# Patient Record
Sex: Male | Born: 1980 | Race: White | Hispanic: No | Marital: Single | Smoking: Former smoker
Health system: Southern US, Community
[De-identification: ages and names within clinical notes are randomized; demographics above are authoritative.]

## PROBLEM LIST (undated history)

## (undated) DIAGNOSIS — F988 Other specified behavioral and emotional disorders with onset usually occurring in childhood and adolescence: Secondary | ICD-10-CM

## (undated) HISTORY — DX: Other specified behavioral and emotional disorders with onset usually occurring in childhood and adolescence: F98.8

---
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
## 2002-01-01 ENCOUNTER — Emergency Department (HOSPITAL_COMMUNITY): Admission: EM | Admit: 2002-01-01 | Discharge: 2002-01-01 | Payer: Self-pay | Admitting: Emergency Medicine

## 2002-06-18 ENCOUNTER — Encounter: Payer: Self-pay | Admitting: Internal Medicine

## 2002-06-18 ENCOUNTER — Encounter: Admission: RE | Admit: 2002-06-18 | Discharge: 2002-06-18 | Payer: Self-pay | Admitting: Internal Medicine

## 2003-05-25 ENCOUNTER — Emergency Department (HOSPITAL_COMMUNITY): Admission: AD | Admit: 2003-05-25 | Discharge: 2003-05-25 | Payer: Self-pay | Admitting: Family Medicine

## 2006-07-20 ENCOUNTER — Inpatient Hospital Stay (HOSPITAL_COMMUNITY): Admission: EM | Admit: 2006-07-20 | Discharge: 2006-07-21 | Payer: Self-pay | Admitting: Emergency Medicine

## 2006-10-23 ENCOUNTER — Emergency Department (HOSPITAL_COMMUNITY): Admission: EM | Admit: 2006-10-23 | Discharge: 2006-10-23 | Payer: Self-pay | Admitting: Emergency Medicine

## 2010-10-01 NOTE — Consult Note (Signed)
Kevin Johnson, Kevin Johnson       ACCOUNT NO.:  [id number]   MEDICAL RECORD NO.:  [id number]          PATIENT TYPE:  INP   LOCATION:  6715                         FACILITY:  [facility]   PHYSICIAN:  Antonietta Breach, M.D.  DATE OF BIRTH:  [birth year]   DATE OF CONSULTATION:  07/21/2006  DATE OF DISCHARGE:  07/21/2006                                 CONSULTATION   REQUESTING PHYSICIAN:  Lonia Blood, M.D.   REASON FOR CONSULTATION:  Overdose.   HISTORY OF PRESENT ILLNESS:  [name] is a 30 year old  male admitted to the [facility] on July 20, 2006 after an overdose.   Mr. Lurry's girlfriend broke up with him the night before admission.  He drank unknown quantities of alcohol and went into a blackout.  He is  reported to have taken 25 tablets of Xanax; however, he does not recall  taking the overdose nor does he recall any suicidal thoughts.  He does  recall feeling very depressed after his girlfriend gave him the news.   Currently, he states that the disappointment is not anywhere near worth  taking his life for.  He denies any suicidal thoughts.  He denies  thoughts of harming others.  He does not have any hallucinations or  delusions.  He reports that he has many close friends and a number of  interests to live for.  He has positive goals for the future and hope.   His close friends were visiting him in the hospital today.  He is  socially appropriate and cooperative.   The patient reports that the girlfriend breaking up with him is not the  first time that she has indicated that she is not interested in the  relationship.  She has not shown significant interest for several weeks  and...Marland KitchenMarland KitchenMarland Kitchen   DICTATION ENDED AT THIS POINT      Antonietta Breach, M.D.  Electronically Signed     JW/MEDQ  D:  07/23/2006  T:  07/24/2006  Job:  [id number]

## 2010-10-01 NOTE — Discharge Summary (Signed)
Kevin Johnson, Kevin Johnson       ACCOUNT NO.:  [id number]   MEDICAL RECORD NO.:  [id number]          PATIENT TYPE:  INP   LOCATION:  6715                         FACILITY:  [facility]   PHYSICIAN:  Altha Harm, MDDATE OF BIRTH:  14-Jul-1980   DATE OF ADMISSION:  07/20/2006  DATE OF DISCHARGE:  07/21/2006                               DISCHARGE SUMMARY   DISCHARGE DISPOSITION:  Home.   FINAL DISCHARGE DIAGNOSES:  1. Failed suicide attempt.  2. History of anxiety.  3. History of depression.  4. History of alcohol abuse.   DISCHARGE MEDICATIONS:  1. Xanax 0.5 mg p.o. t.i.d. p.r.n.  2. Thiamine 50 mg p.o. daily.  3. Folate 1 mg p.o. daily.   CONSULTANTS:  Dr. Jeanie Sewer, psychiatry.   PROCEDURE:  None.   DIAGNOSTIC STUDIES:  Portable chest x-ray, which shows no acute  findings.   PRIMARY CARE PHYSICIAN:  Unassigned.   ALLERGIES:  NO KNOWN DRUG ALLERGIES.   CHIEF COMPLAINT:  Obtundation after taking 25 Xanax.   HISTORY OF PRESENT ILLNESS:  Please see H&P dictated by Dr. Mikeal Hawthorne for  details of the history of present illness.   HOSPITAL COURSE:  The patient was admitted on an observation basis.  He  was given IV hydration and placed on a monitor.  The patient had no  dysrhythmias or ectopy noted throughout his hospitalization.  Approximately 6 hours after admission, the patient awoke and had no  respiratory distress throughout the rest of his observation period.  The  patient was seen in consultation by Dr. Jeanie Sewer for his failed  suicidal attempt.  Dr. Jeanie Sewer felt that he could function as an  outpatient, for follow up with outpatient psychiatric therapy and  recommended that his dose of Xanax be decreased from 1 mg p.o. t.i.d.  down to 0.5 mg p.o. t.i.d. p.r.n.  The patient was given enough Xanax to  last for 4 days, at which time, he is to follow up with outpatient  psychiatry for further management.   CONDITION ON DISCHARGE:  Stable.   The patient has been  cautioned against alcohol abuse and overuse.  The  patient was offered opportunity for rehab, at this time he declined.      Altha Harm, MD  Electronically Signed     MAM/MEDQ  D:  07/21/2006  T:  07/22/2006  Job:  [id number]   cc:   Antonietta Breach, M.D.

## 2010-10-01 NOTE — Consult Note (Signed)
Kevin Johnson, Kevin Johnson       ACCOUNT NO.:  [id number]   MEDICAL RECORD NO.:  [id number]          PATIENT TYPE:  INP   LOCATION:  6715                         FACILITY:  [facility]   PHYSICIAN:  Antonietta Breach, M.D.  DATE OF BIRTH:  [birth year]   DATE OF CONSULTATION:  07/21/2006  DATE OF DISCHARGE:  07/21/2006                                 CONSULTATION   REQUESTING PHYSICIAN:  Lonia Blood, M.D.   REASON FOR CONSULTATION:  Overdose.   HISTORY OF PRESENT ILLNESS:  Mr. Kevin Johnson is a 30 year old  male admitted to the [facility] on 6 March due to an overdose.   [name] denies any suicidal intent.  He does not remember any  suicidal ideation.  He does remember getting into a conversation with is  ex-girlfriend where she repeated her previous response to him that she  is not interested in the relationship.   He reports that for several weeks, she has been showing a lack of  interest, and he states that a loss of her relationship would not  warrant suicide.  He remembers starting to drink large quantities of  alcohol.  He does not recall the overdose of Xanax nor does he recall  any suicidal thoughts.   He was very shocked when he woke up in a hospital.  He has normal  interest in the future with constructive goals.  He has no  hallucinations or delusions.  He has several supportive friends who are  visiting him in the hospital.   He does have a history of panic attacks, feeling on edge, excess worry,  muscle tension that have been treated with Xanax 1 mg 3 times a day.  He  is socially appropriate and cooperative with staff care and is oriented  to all spheres.   PAST PSYCHIATRIC HISTORY:  Mr. Kevin Johnson has been tried on Celexa which  did not work for his anxiety, and his primary care physician has  maintained him on Xanax 1 mg 3 times a day.  He has no history of  suicide attempts.  He has no history of psychiatric are.   He has no history of decreased  need for sleep or increased energy.  He  has no history of hallucinations or delusions.  He denies any illegal  drugs.   He does acknowledge that he has been convicted of a DWI and did attend  outpatient alcohol classes.   FAMILY PSYCHIATRIC HISTORY:  None known.   SOCIAL HISTORY:  [name] is single.  He is a Engineer, agricultural.  He has no children.  He works as an International aid/development worker at [employer].  He  lives alone.  Please see the history above.   GENERAL MEDICAL PROBLEMS:  Status post alcohol and Xanax intoxication.   MEDICATIONS:  The MAR is reviewed.  The patient is on folic acid 1 mg  daily and thiamine 100 mg daily.   ALLERGIES:  No known drug allergies.   LABORATORY DATA:  WBC 8.6, hemoglobin 13.9, platelet count 266.  INR  1.4.  Metabolic panel shows a BUN of 9, creatinine 1.02, SGOT 30, SGPT  24, calcium 9.2.  Albumin 4.5, lipase 22. Tylenol was negative.  Aspirin  negative.  Urine drug screen was positive for tetrahydrocannabinol and  benzodiazepines.  Alcohol level upon ER assessment was 163.   REVIEW OF SYSTEMS:  CONSTITUTIONAL: Afebrile.  HEAD: No trauma.  EYES:  No visual changes.  EARS: No hearing impairment.  NOSE:  No rhinorrhea.  MOUTH:  No sore throat.  NEUROLOGIC: Unremarkable.  PSYCHIATRIC:  As  above.  CARDIOVASCULAR: No chest pain, palpitations, or edema.  RESPIRATORY: No coughing or wheezing. GASTROINTESTINAL: No nausea,  vomiting, diarrhea.  GENITOURINARY:  No dysuria.  SKIN: Unremarkable.  ENDOCRINE/METABOLIC:  Unremarkable.  MUSCULOSKELETAL:  No deformities,  weakness, or atrophy.  HEMATOLOGIC/LYMPHATIC:  Unremarkable.   PHYSICAL EXAMINATION:  VITAL SIGNS:  Temperature 98.7, pulse 67,  respirations 18,  blood pressure 122/77, O2 saturation on room air 97%.  GENERAL:  Kevin Johnson has no alcohol withdrawal signs or symptoms.  He  has no tremor or sweats.   MENTAL STATUS EXAM:  Mr. Kevin Johnson is a young male appearing his  chronologic age of 25  years, partially reclined in a supine position in  his hospital bed, alert with good eye contact.  His psychomotor tone is  generally within normal limits.  He is well groomed and socially  appropriate.  He is cooperative with the interview.  His affect is  slightly anxious at baseline but with broad and appropriate responses.  Mood is slightly anxious.  He is oriented to all spheres.  His memory is  intact to immediate, recent, and remote except for the blackout period.  Please see the History of Present Illness.  His fund of knowledge and  intelligence are within normal limits.  His speech involves normal rate  and prosody.  Though process is logical, coherent, goal directed.  No  looseness of associations.  Thought content: No thoughts of harming  himself.  No thoughts of harming others.  No delusions.  No  hallucinations.  Concentration is grossly within normal limits.  His  insight is good.  His judgment is intact.   ASSESSMENT:  AXIS I:  1. 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  2. Adjustment disorder with mixed disturbance of emotions and conduct.  3. Alcohol abuse, rule out polysubstance dependence, cannabis abuse.  AXIS II:  Deferred.  AXIS III:  See General Medical Problems.  AXIS IV:  Primary support group.  AXIS V:  55.   [name] is no longer at risk to harm himself or others after  recovering from his severe intoxication.  He agrees to call emergency  services immediately for any thoughts of harming himself, thoughts of  harming others, or distress.   He realizes that alcohol can severely impair his judgment and decrease  his inhibitions as well as increasing his impulsivity.   The undersigned provided ego supportive psychotherapy and education.  The patient declines the recommendation for inpatient CD rehabilitation;  however, he mentioned that he would be interested in an outpatient  chemical dependency program.  RECOMMENDATIONS:  1. Would ask  the case manager to set this patient up with a chemical      dependence intensive outpatient program at one of the psychiatric      clinics attached to [facility], [facility], or [facility].  2. This patient would also be a candidate for cognitive behavioral      therapy with deep breathing and progressive muscle relaxation with      a goal of trying  to eliminate his need for benzodiazepines.  3. During his psychiatric followup as part of the intensive outpatient      program, he could pursue possibly a retrial of an SSRI.  May      patients have history of suboptimal dosing and suboptimal duration      of SSRI trial in the treatment of anxiety because anxiety      treatment, particularly the cognitive anxiety symptoms, requires a      much greater length of treatment than depression.      Antonietta Breach, M.D.  Electronically Signed     JW/MEDQ  D:  07/23/2006  T:  07/23/2006  Job:  [id number]   cc:   Lonia Blood, M.D.

## 2010-10-01 NOTE — H&P (Signed)
NAMETYRONE, PAUTSCH       ACCOUNT NO.:  [id number]   MEDICAL RECORD NO.:  [id number]          PATIENT TYPE:  INP   LOCATION:  6715                         FACILITY:  [facility]   PHYSICIAN:  Lonia Blood, M.D.      DATE OF BIRTH:  [birth year]   DATE OF ADMISSION:  07/20/2006  DATE OF DISCHARGE:                              HISTORY & PHYSICAL   PRIMARY CARE PHYSICIAN:  Dr. Oneta Rack.   PRESENTING COMPLAINT:  Drug overdose.   HISTORY OF PRESENT ILLNESS:  Patient is a 30 year old white male with no  significant past medical history, who apparently broke up with his  girlfriend last night.  He was feeling so bad and depressed.  He took 25  tablets of Xanax and lots of alcohol.  He was brought in to the ED  obtunded.  Initial evaluation included giving the patient Narcan, but no  response.  However, he did wake up with some flumazenil 0.3 mg.  Patient  then drifted backwards into sleep at this point.  He is unable to give  any coherent history, although he is arousable with deep sternal rub.   PAST MEDICAL HISTORY:  Negative, except for possible depression.   ALLERGIES:  NONE.   MEDICATIONS:  None.   SOCIAL HISTORY:  Patient is single.  He drinks alcohol, unknown quantity  and no reported tobacco use.   FAMILY HISTORY:  Is currently unobtainable.   REVIEW OF SYSTEMS:  Also unobtainable due to patient's obtundation.   PHYSICAL EXAMINATION:  VITAL SIGNS:  On exam temperature 98.6, blood  pressure 108/42, pulse 87, respiratory rate 16.  Sats 95% on room air.  GENERAL:  Patient is currently obtunded, but calm, in no acute distress.  HEENT:  PERRL, EOMI.  NECK:  Is supple.  No JVD.  No lymphadenopathy.  RESPIRATORY:  He has good air entry bilaterally.  No wheezes.  No rales.  CARDIOVASCULAR:  Patient has S1, S2.  No murmurs.  ABDOMEN:  His abdomen is soft, nontender.  Positive bowel sounds.  EXTREMITIES:  No edema, cyanosis or clubbing.   LABS:  Showed white count 8.6, hemoglobin  13.9, and platelet count 266.  PT is 10.7, INR 1.4.  Urinalysis essentially negative.  Urine drug  screen is pending.  Chest x-ray and EKG are all pending.   ASSESSMENT:  Therefore, this is a 30 year old gentleman brought in  secondary to what appeared to be suicidal attempt with drug overdose.  Patient is currently obtunded, but arousable.   PLAN:  1. Drug overdose.  It appears this all depends on the Xanax that he      took.  The fact that he responded to flumazenil confirms that.      Will await urine drug screen to see if there are other substances      that he may have taken.  In the meantime, will continue supportive      measures only on telemetry, hydrate the patient and follow him      closely with suicidal precaution.  2. Suicide attempt.  Again will place patient on suicidal precaution      with a sitter in the  room.  Consult psychiatry once he is fully      awake and continue management per psych.  3. Alcohol intoxication abuse.  Again, serum alcohol level is      currently pending, but per the family he drank and he drank      excessively today prior to coming to the hospital.  I will start      him on thiamine, folate and also DT watch.  Otherwise, patient may      only require conservative measures at this point and supportive      measures most likely.      Lonia Blood, M.D.  Electronically Signed     LG/MEDQ  D:  07/20/2006  T:  07/21/2006  Job:  [id number]

## 2012-07-11 ENCOUNTER — Other Ambulatory Visit: Payer: Self-pay | Admitting: Family Medicine

## 2012-07-11 ENCOUNTER — Ambulatory Visit
Admission: RE | Admit: 2012-07-11 | Discharge: 2012-07-11 | Disposition: A | Discharge status: Home / Self Care | Payer: BC Managed Care – PPO | Source: Ambulatory Visit | Attending: Family Medicine | Admitting: Family Medicine

## 2012-07-11 DIAGNOSIS — S43394A Dislocation of other parts of right shoulder girdle, initial encounter: Secondary | ICD-10-CM

## 2012-07-26 ENCOUNTER — Other Ambulatory Visit: Payer: Self-pay | Admitting: Family Medicine

## 2012-07-26 DIAGNOSIS — R079 Chest pain, unspecified: Secondary | ICD-10-CM

## 2012-08-02 ENCOUNTER — Other Ambulatory Visit: Payer: BC Managed Care – PPO

## 2014-02-20 IMAGING — CT CT CHEST LIMITED W/O CM
2 of 3 series · 16 of 32 positions shown, 20 images · non-contrast
Comparison: Plain films 07/20/2006.

CLINICAL DATA: Question dislocation of the SC joint.  Fall.  Upper
chest pain.

CHEST CT WITHOUT CONTRAST
TECHNIQUE: Multidetector CT imaging of the chest was performed
following the standard protocol without intravenous contrast.

[Series 3: standard windows · axial · 0.63mm/px · z∈[-98,-91]mm · 2 of 29 slices shown]
[im 3/29  mediastinal]
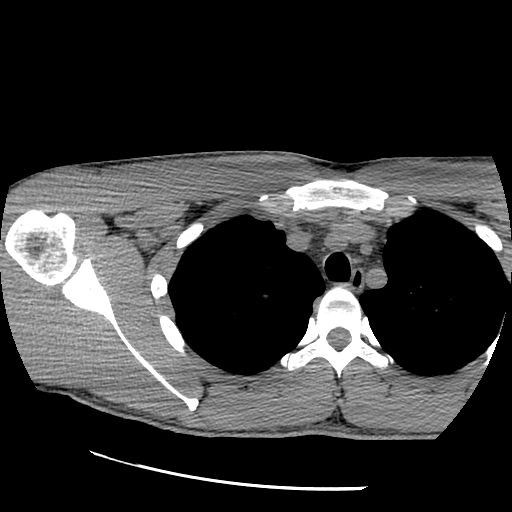
[im 6/29  mediastinal]
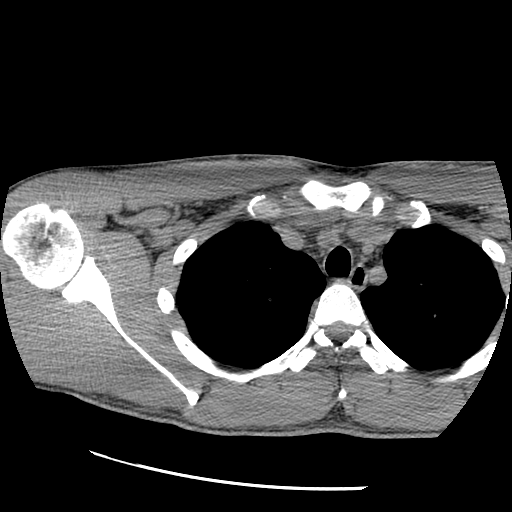

[Series 104: cor soft · coronal · 0.63mm/px · 14 of 39 slices shown, 18 images]
[im 3/39  mediastinal]
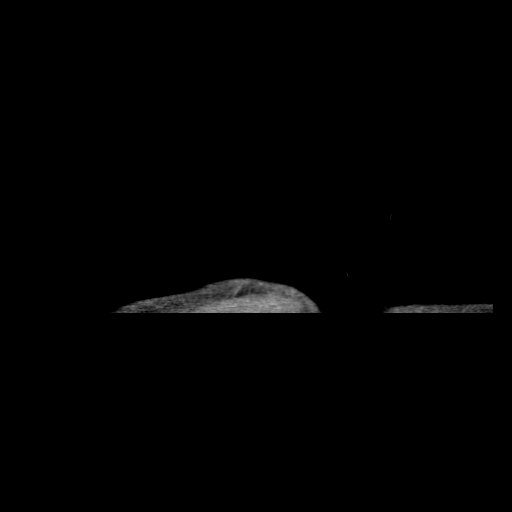
[im 3/39  lung]
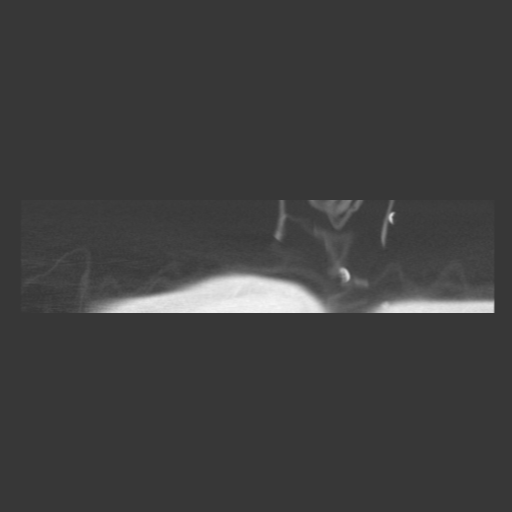
[im 6/39  lung]
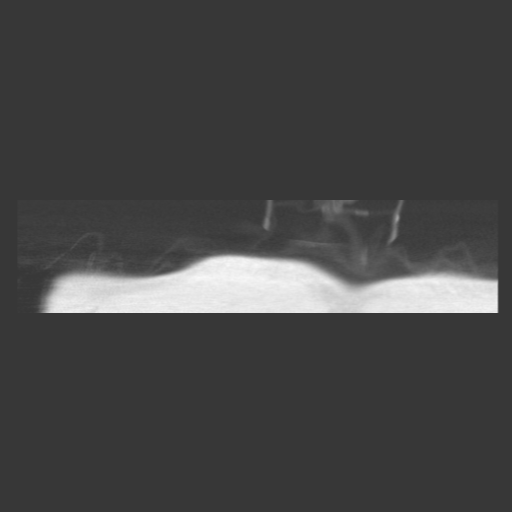
[im 8/39  lung]
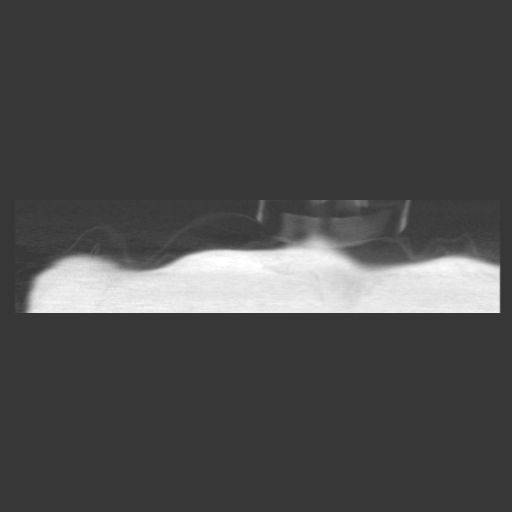
[im 11/39  lung]
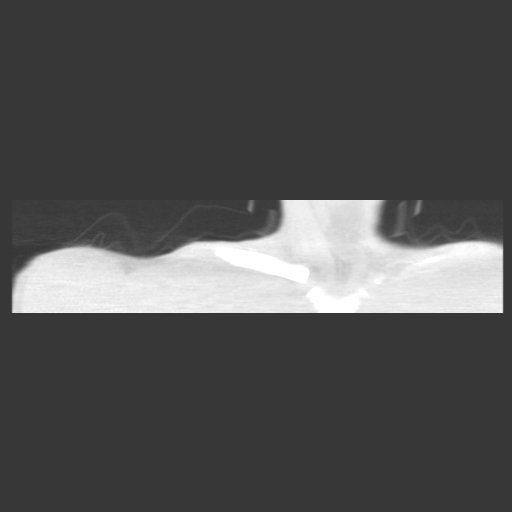
[im 13/39  mediastinal]
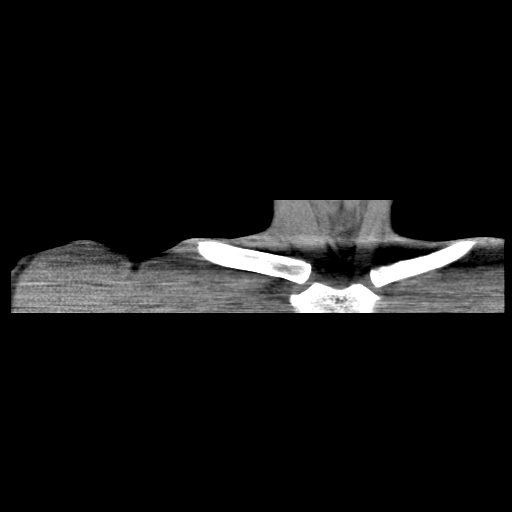
[im 13/39  lung]
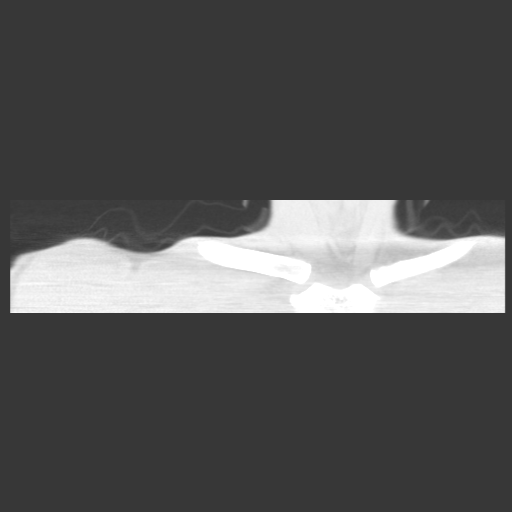
[im 16/39  lung]
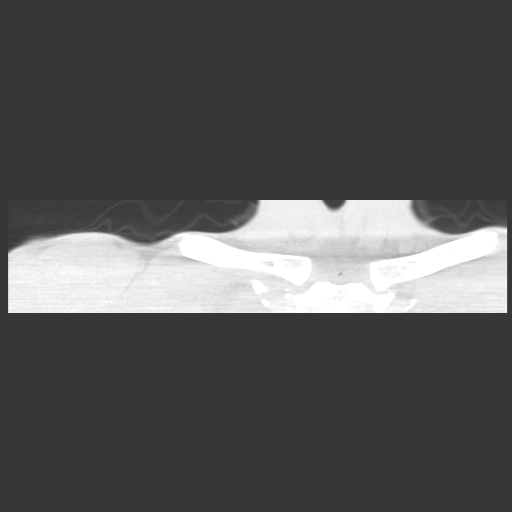
[im 18/39  lung]
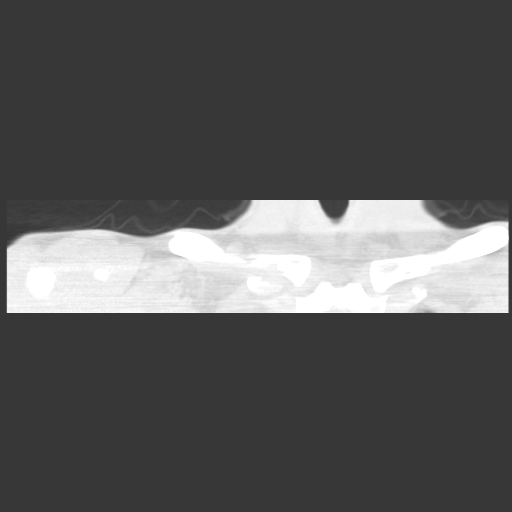
[im 21/39  lung]
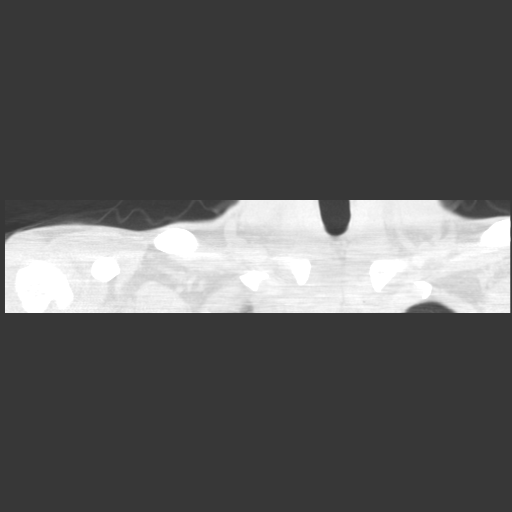
[im 23/39  mediastinal]
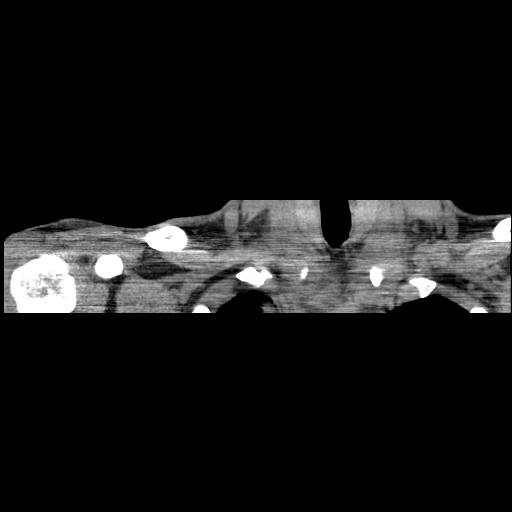
[im 23/39  lung]
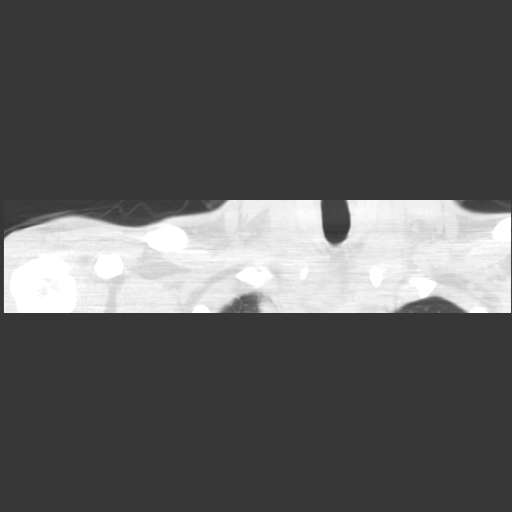
[im 26/39  lung]
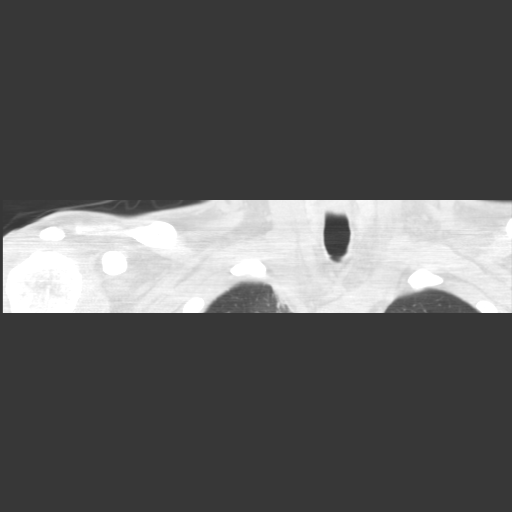
[im 28/39  lung]
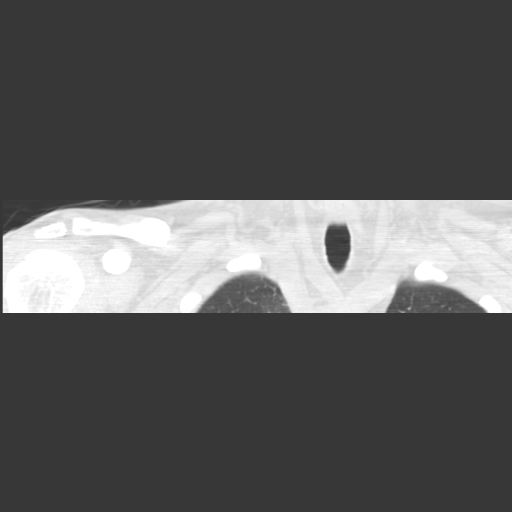
[im 31/39  lung]
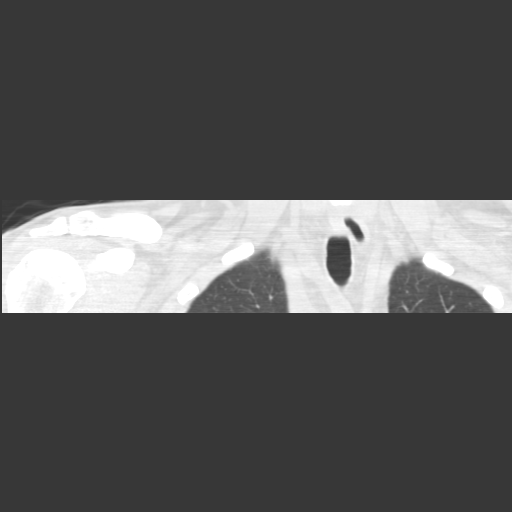
[im 33/39  mediastinal]
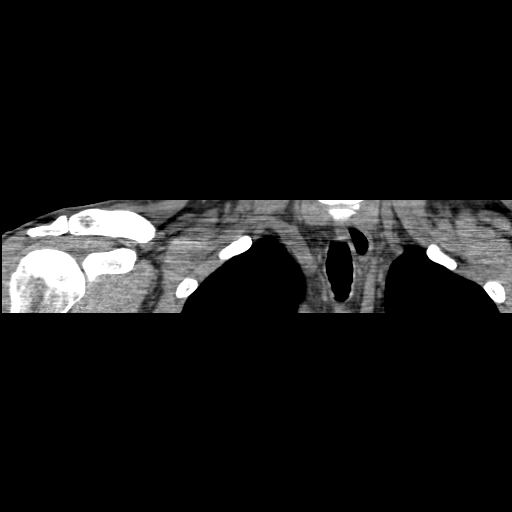
[im 33/39  lung]
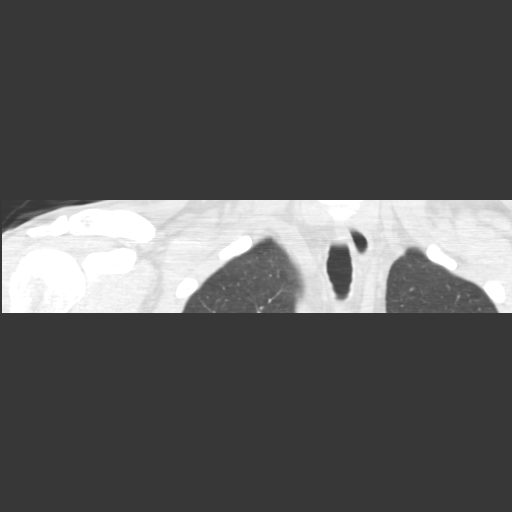
[im 36/39  lung]
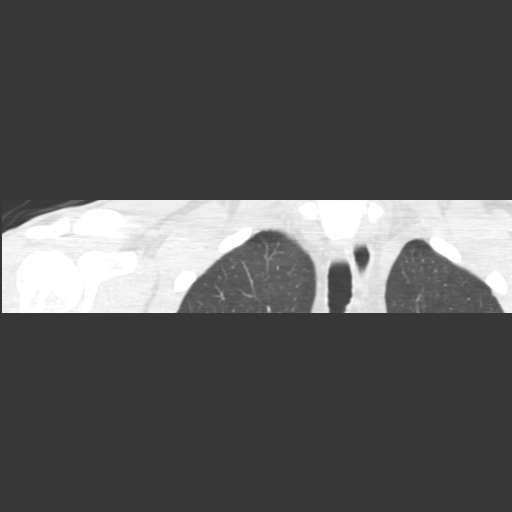

[16 of 32 positions shown; findings below may reference images not displayed]

FINDINGS: Limited chest CT was performed through the region of the
sternoclavicular joints and right clavicle.  Sternoclavicular
joints are symmetric.  No evidence for SC joint dislocation.  No
fracture.  Right AC joint and clavicle are unremarkable.
Visualized upper ribs and lung fields are unremarkable.
IMPRESSION: No evidence for SC joint dislocation.  No acute bony abnormality
visualized.

## 2014-07-08 ENCOUNTER — Ambulatory Visit: Payer: Self-pay | Admitting: Internal Medicine

## 2014-07-08 ENCOUNTER — Encounter: Payer: Self-pay | Admitting: Internal Medicine

## 2014-07-08 VITALS — BP 120/82 | HR 76 | Temp 98.1°F | Resp 16 | Ht 72.25 in | Wt 174.0 lb

## 2014-07-08 DIAGNOSIS — K648 Other hemorrhoids: Secondary | ICD-10-CM

## 2014-07-08 MED ORDER — HYDROCORTISONE ACETATE 25 MG RE SUPP: 25.0000 mg | Freq: Two times a day (BID) | RECTAL | Status: AC | PRN

## 2014-07-08 NOTE — Patient Instructions (Signed)
About Hemorrhoids  Hemorrhoids are swollen veins in the lower rectum and anus.  Also called piles, hemorrhoids are a common problem.  Hemorrhoids may be internal (inside the rectum) or external (around the anus).  Internal Hemorrhoids  Internal hemorrhoids are often painless, but they rarely cause bleeding.  The internal veins may stretch and fall down (prolapse) through the anus to the outside of the body.  The veins may then become irritated and painful.  External Hemorrhoids  External hemorrhoids can be easily seen or felt around the anal opening.  They are under the skin around the anus.  When the swollen veins are scratched or broken by straining, rubbing or wiping they sometimes bleed.  How Hemorrhoids Occur  Veins in the rectum and around the anus tend to swell under pressure.  Hemorrhoids can result from increased pressure in the veins of your anus or rectum.  Some sources of pressure are:   Straining to have a bowel movement because of constipation  Waiting too long to have a bowel movement  Coughing and sneezing often  Sitting for extended periods of time, including on the toilet  Diarrhea  Obesity  Trauma or injury to the anus  Some liver diseases  Stress  Family history of hemorrhoids  Pregnancy  Pregnant women should try to avoid becoming constipated, because they are more likely to have hemorrhoids during pregnancy.  In the last trimester of pregnancy, the enlarged uterus may press on blood vessels and causes hemorrhoids.  In addition, the strain of childbirth sometimes causes hemorrhoids after the birth.  Symptoms of Hemorrhoids  Some symptoms of hemorrhoids include:  Swelling and/or a tender lump around the anus  Itching, mild burning and bleeding around the anus  Painful bowel movements with or without constipation  Bright red blood covering the stool, on toilet paper or in the toilet bowel.   Symptoms usually go away within a few days.  Always  talk to your doctor about any bleeding to make sure it is not from some other causes.  Diagnosing and Treating Hemorrhoids  Diagnosis is made by an examination by your healthcare provider.  Special test can be performed by your doctor.    Most cases of hemorrhoids can be treated with:  High-fiber diet: Eat more high-fiber foods, which help prevent constipation.  Ask for more detailed fiber information on types and sources of fiber from your healthcare provider.  Fluids: Drink plenty of water.  This helps soften bowel movements so they are easier to pass.  Sitz baths and cold packs: Sitting in lukewarm water two or three times a day for 15 minutes cleases the anal area and may relieve discomfort.  If the water is too hot, swelling around the anus will get worse.  Placing a cloth-covered ice pack on the anus for ten minutes four times a day can also help reduce selling.  Gently pushing a prolapsed hemorrhoid back inside after the bath or ice pack can be helpful.  Medications: For mild discomfort, your healthcare provider may suggest over-the-counter pain medication or prescribe a cream or ointment for topical use.  The cream may contain witch hazel, zinc oxide or petroleum jelly.  Medicated suppositories are also a treatment option.  Always consult your doctor before applying medications or creams.  Procedures and surgeries: There are also a number of procedures and surgeries to shrink or remove hemorrhoids in more serious cases.  Talk to your physician about these options.  You can often prevent hemorrhoids or keep   them from becoming worse by maintaining a healthy lifestyle.  Eat a fiber-rich diet of fruits, vegetables and whole grains.  Also, drink plenty of water and exercise regularly.  Hemorrhoids Hemorrhoids are swollen veins around the rectum or anus. There are two types of hemorrhoids:   Internal hemorrhoids. These occur in the veins just inside the rectum. They may poke through to the  outside and become irritated and painful.  External hemorrhoids. These occur in the veins outside the anus and can be felt as a painful swelling or hard lump near the anus. CAUSES  Pregnancy.   Obesity.   Constipation or diarrhea.   Straining to have a bowel movement.   Sitting for long periods on the toilet.  Heavy lifting or other activity that caused you to strain.  Anal intercourse. SYMPTOMS   Pain.   Anal itching or irritation.   Rectal bleeding.   Fecal leakage.   Anal swelling.   One or more lumps around the anus.  DIAGNOSIS  Your caregiver may be able to diagnose hemorrhoids by visual examination. Other examinations or tests that may be performed include:   Examination of the rectal area with a gloved hand (digital rectal exam).   Examination of anal canal using a small tube (scope).   A blood test if you have lost a significant amount of blood.  A test to look inside the colon (sigmoidoscopy or colonoscopy). TREATMENT Most hemorrhoids can be treated at home. However, if symptoms do not seem to be getting better or if you have a lot of rectal bleeding, your caregiver may perform a procedure to help make the hemorrhoids get smaller or remove them completely. Possible treatments include:   Placing a rubber band at the base of the hemorrhoid to cut off the circulation (rubber band ligation).   Injecting a chemical to shrink the hemorrhoid (sclerotherapy).   Using a tool to burn the hemorrhoid (infrared light therapy).   Surgically removing the hemorrhoid (hemorrhoidectomy).   Stapling the hemorrhoid to block blood flow to the tissue (hemorrhoid stapling).  HOME CARE INSTRUCTIONS   Eat foods with fiber, such as whole grains, beans, nuts, fruits, and vegetables. Ask your doctor about taking products with added fiber in them (fibersupplements).  Increase fluid intake. Drink enough water and fluids to keep your urine clear or pale yellow.    Exercise regularly.   Go to the bathroom when you have the urge to have a bowel movement. Do not wait.   Avoid straining to have bowel movements.   Keep the anal area dry and clean. Use wet toilet paper or moist towelettes after a bowel movement.   Medicated creams and suppositories may be used or applied as directed.   Only take over-the-counter or prescription medicines as directed by your caregiver.   Take warm sitz baths for 15-20 minutes, 3-4 times a day to ease pain and discomfort.   Place ice packs on the hemorrhoids if they are tender and swollen. Using ice packs between sitz baths may be helpful.   Put ice in a plastic bag.   Place a towel between your skin and the bag.   Leave the ice on for 15-20 minutes, 3-4 times a day.   Do not use a donut-shaped pillow or sit on the toilet for long periods. This increases blood pooling and pain.  SEEK MEDICAL CARE IF:  You have increasing pain and swelling that is not controlled by treatment or medicine.  You have uncontrolled bleeding.  You have difficulty or you are unable to have a bowel movement.  You have pain or inflammation outside the area of the hemorrhoids. MAKE SURE YOU:  Understand these instructions.  Will watch your condition.  Will get help right away if you are not doing well or get worse. Document Released: 04/29/2000 Document Revised: 04/18/2012 Document Reviewed: 03/06/2012 [facility] Patient Information 2015 [place], [place]. This information is not intended to replace advice given to you by your health care provider. Make sure you discuss any questions you have with your health care provider.

## 2014-07-08 NOTE — Progress Notes (Signed)
   Subjective:    Patient ID: Kevin Johnson, male    DOB: Feb 14, 1981, 34 y.o.   MRN: [id number]  HPI  A nice 34 yo SWM returning after 5 yr abseb=nce c/o also of a 5-6 yr hx/o intermittent bright red rectal bleeding. Denies any significant upper GI sx's. Has occas mild diarrhea about 1 x / week. Has notice occas blood in or with BMs.   - On No Meds - Denies allergies. PMHx- Neg  Review of Systems     10 pt ROS totally negative except as above.     Objective:   Physical Exam BP 120/82 mmHg  Pulse 76  Temp(Src) 98.1 F (36.7 C)  Resp 16  Ht 6' 0.25" (1.835 m)  Wt 174 lb (78.926 kg)  BMI 23.44 kg/m2 HEENT - Eac's patent. TM's Nl. EOM's full. PERRLA. NasoOroPharynx clear. Neck - supple. Nl Thyroid. Carotids 2+ & No bruits, nodes, JVD Chest - Clear equal BS w/o Rales, rhonchi, wheezes. Cor - Nl HS. RRR w/o sig MGR. PP 1(+). No edema. Abd - No palpable organomegaly, masses or tenderness. BS nl. GU - Nl male. Npo external peri-rectal abnormalities as teas , fissures or fistulas.  DRE - # 2 soft internal hemorrhoids . Shool hemmocult is negATIVE MS- FROM w/o deformities. Muscle power, tone and bulk Nl. Gait Nl. Neuro - No obvious Cr N abnormalities. Sensory, motor and Cerebellar functions appear Nl w/o focal abnormalities. Psyche - Mental status normal & appropriate.  No delusions, ideations or obvious mood abnormalities.    Assessment & Plan:   1. Internal hemorrhoid, bleeding  - Trial with Anusol 2.5% HC supp and is bleeding persists will need referral to GI

## 2018-05-31 ENCOUNTER — Emergency Department (HOSPITAL_COMMUNITY)
Admission: EM | Admit: 2018-05-31 | Discharge: 2018-05-31 | Disposition: A | Discharge status: Home / Self Care | Payer: Self-pay | Attending: Emergency Medicine | Admitting: Emergency Medicine

## 2018-05-31 ENCOUNTER — Encounter (HOSPITAL_COMMUNITY): Payer: Self-pay | Admitting: Emergency Medicine

## 2018-05-31 ENCOUNTER — Other Ambulatory Visit: Payer: Self-pay

## 2018-05-31 ENCOUNTER — Emergency Department (HOSPITAL_COMMUNITY): Payer: Self-pay

## 2018-05-31 DIAGNOSIS — S3022XA Contusion of scrotum and testes, initial encounter: Secondary | ICD-10-CM

## 2018-05-31 DIAGNOSIS — N451 Epididymitis: Secondary | ICD-10-CM | POA: Insufficient documentation

## 2018-05-31 DIAGNOSIS — Z87891 Personal history of nicotine dependence: Secondary | ICD-10-CM | POA: Insufficient documentation

## 2018-05-31 LAB — URINALYSIS, ROUTINE W REFLEX MICROSCOPIC
Bacteria, UA: NONE SEEN
Bilirubin Urine: NEGATIVE
Glucose, UA: NEGATIVE mg/dL
Hgb urine dipstick: NEGATIVE
Ketones, ur: NEGATIVE mg/dL
Nitrite: NEGATIVE
PROTEIN: NEGATIVE mg/dL
Specific Gravity, Urine: 1.027 (ref 1.005–1.030)
pH: 5 (ref 5.0–8.0)

## 2018-05-31 NOTE — ED Provider Notes (Signed)
[facility] EMERGENCY DEPARTMENT Provider Note  CSN: [id number] Arrival date & time: 05/31/18 0012  Chief Complaint(s) Testicle Pain  HPI Kevin Johnson is a 38 y.o. male   The history is provided by the patient.  Testicle Pain  This is a new problem. Episode onset: 4 days. The problem occurs constantly. The problem has not changed since onset.Pertinent negatives include no chest pain, no abdominal pain, no headaches and no shortness of breath. Exacerbated by: activity, palpation. The symptoms are relieved by ice and NSAIDs.   Patient reports that 4 days ago he was hiking.  When he went to sit down he "sat down onto his right testicle."  Since then the patient has been complaining of worsening right testicular pain and swelling.    Patient denies any penile discharge.  He has not been sexually active in 3 months.   Past Medical History History reviewed. No pertinent past medical history. There are no active problems to display for this patient.  Home Medication(s) Prior to Admission medications   Not on File                                                                                                                                    Past Surgical History History reviewed. No pertinent surgical history. Family History No family history on file.  Social History Social History   Tobacco Use  . Smoking status: Former Smoker    Packs/day: 0.50    Types: Cigarettes  . Smokeless tobacco: Never Used  Substance Use Topics  . Alcohol use: No    Alcohol/week: 0.0 standard drinks  . Drug use: Never   Allergies Patient has no allergy information on record.  Review of Systems Review of Systems  Respiratory: Negative for shortness of breath.   Cardiovascular: Negative for chest pain.  Gastrointestinal: Negative for abdominal pain.  Genitourinary: Positive for testicular pain.  Neurological: Negative for headaches.   All other systems are  reviewed and are negative for acute change except as noted in the HPI  Physical Exam Vital Signs  I have reviewed the triage vital signs BP 127/76   Pulse 72   Temp 98.2 F (36.8 C) (Oral)   Resp 18   SpO2 100%   Physical Exam Vitals signs reviewed.  Constitutional:      General: He is not in acute distress.    Appearance: He is well-developed. He is not diaphoretic.  HENT:     Head: Normocephalic and atraumatic.     Jaw: No trismus.     Right Ear: External ear normal.     Left Ear: External ear normal.     Nose: Nose normal.  Eyes:     General: No scleral icterus.    Conjunctiva/sclera: Conjunctivae normal.  Neck:     Musculoskeletal: Normal range of motion.     Trachea: Phonation normal.  Cardiovascular:  Rate and Rhythm: Normal rate and regular rhythm.  Pulmonary:     Effort: Pulmonary effort is normal. No respiratory distress.     Breath sounds: No stridor.  Abdominal:     General: There is no distension.     Hernia: There is no hernia in the right inguinal area or left inguinal area.  Genitourinary:    Penis: Circumcised.      Scrotum/Testes:        Right: Tenderness and swelling present.        Left: Tenderness or swelling not present.     Epididymis:     Right: Enlarged. Tenderness present.     Left: Not enlarged. No tenderness.  Musculoskeletal: Normal range of motion.  Neurological:     Mental Status: He is alert and oriented to person, place, and time.  Psychiatric:        Behavior: Behavior normal.     ED Results and Treatments Labs (all labs ordered are listed, but only abnormal results are displayed) Labs Reviewed  URINALYSIS, ROUTINE W REFLEX MICROSCOPIC - Abnormal; Notable for the following components:      Result Value   Leukocytes, UA TRACE (*)    All other components within normal limits                                                                                                                         EKG  EKG  Interpretation  Date/Time:    Ventricular Rate:    PR Interval:    QRS Duration:   QT Interval:    QTC Calculation:   R Axis:     Text Interpretation:        Radiology [place] Scrotum  Result Date: 05/31/2018 CLINICAL DATA:  Right testicular pain and swelling since Sunday. EXAM: SCROTAL ULTRASOUND DOPPLER ULTRASOUND OF THE TESTICLES TECHNIQUE: Complete ultrasound examination of the testicles, epididymis, and other scrotal structures was performed. Color and spectral Doppler ultrasound were also utilized to evaluate blood flow to the testicles. COMPARISON:  None. FINDINGS: Right testicle Measurements: 3.3 x 2.5 x 2 cm. Testicular parenchymal echotexture is mostly homogeneous but there is a poorly defined vague area of mildly decreased echotexture demonstrated in the lower pole of the testis measuring about 1 cm in diameter. There is no contour deformity. This could represent a mass lesion or infiltrative process. Increased flow to this area on color flow Doppler imaging. Left testicle Measurements: 3.2 x 1.8 x 2 cm. No mass or microlithiasis visualized. Right epididymis: Enlarged, heterogeneous, and hypervascular appearance of the tail of the epididymis. Left epididymis: Benign-appearing cyst in the left epididymis measuring 2.2 cm. Likely epididymal cyst or spermatocele. Hydrocele:  None visualized. Varicocele:  None visualized. Pulsed Doppler interrogation of both testes demonstrates normal low resistance arterial and venous waveforms bilaterally. Color Doppler imaging demonstrates increased flow to the right lower testis and inferior epididymis. IMPRESSION: 1. Vague hypoechoic hypervascular focus in the inferior right testis. 2. Right inferior epididymal thickening, heterogeneous  appearance, and increased flow. 3. Given this constellation of findings, this is likely to represent epididymo-orchitis, possibly with a developing right testicular abscess. However, underlying testicular mass remains a  possibility and follow-up after resolution of the acute process is recommended. 4. Benign-appearing left epididymal cyst or spermatocele. 5. No evidence of testicular torsion. Electronically Signed   By: Burman NievesWilliam  Stevens M.D.   On: 05/31/2018 02:19   Pertinent labs & imaging results that were available during my care of the patient were reviewed by me and considered in my medical decision making (see chart for details).  Medications Ordered in ED Medications - No data to display                                                                                                                                  Procedures Procedures  (including critical care time)  Medical Decision Making / ED Course I have reviewed the nursing notes for this encounter and the patient's prior records (if available in EHR or on provided paperwork).    Testicular contusion.  No evidence of torsion on ultrasound.  There was a suspicion for possible mass versus abscess.  Given the clinical picture, likely hematoma.  Recommended supportive management.  He will need urology follow-up.   The patient appears reasonably screened and/or stabilized for discharge and I doubt any other medical condition or other [facility] requiring further screening, evaluation, or treatment in the ED at this time prior to discharge.  The patient is safe for discharge with strict return precautions.   Final Clinical Impression(s) / ED Diagnoses Final diagnoses:  Epididymitis  Contusion of testis, initial encounter   Disposition: Discharge  Condition: Good  I have discussed the results, Dx and Tx plan with the patient who expressed understanding and agree(s) with the plan. Discharge instructions discussed at great length. The patient was given strict return precautions who verbalized understanding of the instructions. No further questions at time of discharge.    ED Discharge Orders    None       Follow Up: Jerilee FieldEskridge, Matthew, MD [address] [place] [id number] [phone]  Schedule an appointment as soon as possible for a visit  For close follow up to assess for testicular contusion      This chart was dictated using voice recognition software.  Despite best efforts to proofread,  errors can occur which can change the documentation meaning.   Nira Connardama, Dasha Kawabata Eduardo, MD 05/31/18 [phone]

## 2018-05-31 NOTE — ED Triage Notes (Addendum)
C/o swelling and pain to R testicle since hiking on Sunday and sitting on scrotum.

## 2018-05-31 NOTE — ED Notes (Signed)
Returned from U/S

## 2020-11-24 ENCOUNTER — Encounter: Payer: Self-pay | Admitting: Internal Medicine

## 2020-11-24 ENCOUNTER — Other Ambulatory Visit: Payer: Self-pay

## 2020-11-24 ENCOUNTER — Ambulatory Visit: Payer: Self-pay | Admitting: Internal Medicine

## 2020-11-24 VITALS — BP 130/80 | HR 83 | Temp 97.7°F | Resp 17 | Ht 72.0 in | Wt 180.8 lb

## 2020-11-24 DIAGNOSIS — L659 Nonscarring hair loss, unspecified: Secondary | ICD-10-CM

## 2020-11-24 DIAGNOSIS — F900 Attention-deficit hyperactivity disorder, predominantly inattentive type: Secondary | ICD-10-CM

## 2020-11-24 MED ORDER — AMPHETAMINE-DEXTROAMPHETAMINE 20 MG PO TABS
ORAL_TABLET | ORAL | 0 refills | Status: DC
Start: 2020-11-24 — End: 2020-12-31

## 2020-11-24 NOTE — Progress Notes (Addendum)
    Kevin Johnson  Date Time Provider Department Center  11/24/2020  2:30 PM Kevin Cowboy, MD [facility] None   History of Present Illness:      The patient is a very nice 40 yo single WM re-establishing care - last seen 2008. Patient moved & had lived in [place] & has since moved back to [place].  Patient has hx/o MPB  and in remote past  ~15+ years ago had undergone hair transplants.      Today he presents with concerns of patchy alopecia about his posterior right sub-occipital area & patchy or spotty areas omn both cheeks of his beard.        Patient also relates hx/o poor focus, concentration and decreased attention span and had been treated in the past with good response to low dose Adderall.                                                                                                                                                                                                                               No formal exam otherwise  Patient desires dermatology referral.    Marinus Maw, MD

## 2020-12-31 ENCOUNTER — Other Ambulatory Visit: Payer: Self-pay | Admitting: Internal Medicine

## 2020-12-31 DIAGNOSIS — F900 Attention-deficit hyperactivity disorder, predominantly inattentive type: Secondary | ICD-10-CM

## 2020-12-31 MED ORDER — AMPHETAMINE-DEXTROAMPHETAMINE 20 MG PO TABS: ORAL_TABLET | ORAL | 0 refills | Status: DC

## 2021-01-27 ENCOUNTER — Other Ambulatory Visit: Payer: Self-pay | Admitting: Internal Medicine

## 2021-01-27 DIAGNOSIS — F900 Attention-deficit hyperactivity disorder, predominantly inattentive type: Secondary | ICD-10-CM

## 2021-01-27 MED ORDER — AMPHETAMINE-DEXTROAMPHETAMINE 20 MG PO TABS: ORAL_TABLET | ORAL | 0 refills | Status: DC

## 2021-03-02 ENCOUNTER — Other Ambulatory Visit: Payer: Self-pay | Admitting: Internal Medicine

## 2021-03-02 DIAGNOSIS — F900 Attention-deficit hyperactivity disorder, predominantly inattentive type: Secondary | ICD-10-CM

## 2021-03-02 MED ORDER — AMPHETAMINE-DEXTROAMPHETAMINE 20 MG PO TABS: ORAL_TABLET | ORAL | 0 refills | Status: DC

## 2021-05-04 ENCOUNTER — Other Ambulatory Visit: Payer: Self-pay | Admitting: Internal Medicine

## 2021-05-04 DIAGNOSIS — F900 Attention-deficit hyperactivity disorder, predominantly inattentive type: Secondary | ICD-10-CM

## 2021-05-04 MED ORDER — AMPHETAMINE-DEXTROAMPHETAMINE 20 MG PO TABS: ORAL_TABLET | ORAL | 0 refills | Status: DC

## 2021-09-09 ENCOUNTER — Other Ambulatory Visit: Payer: Self-pay | Admitting: Internal Medicine

## 2021-09-13 ENCOUNTER — Ambulatory Visit (INDEPENDENT_AMBULATORY_CARE_PROVIDER_SITE_OTHER): Payer: Self-pay | Admitting: Internal Medicine

## 2021-09-13 ENCOUNTER — Encounter: Payer: Self-pay | Admitting: Internal Medicine

## 2021-09-13 ENCOUNTER — Other Ambulatory Visit: Payer: Self-pay | Admitting: Internal Medicine

## 2021-09-13 VITALS — BP 128/84 | HR 69 | Temp 97.9°F | Resp 16 | Ht 72.0 in | Wt 167.4 lb

## 2021-09-13 DIAGNOSIS — F988 Other specified behavioral and emotional disorders with onset usually occurring in childhood and adolescence: Secondary | ICD-10-CM

## 2021-09-13 DIAGNOSIS — F900 Attention-deficit hyperactivity disorder, predominantly inattentive type: Secondary | ICD-10-CM

## 2021-09-13 MED ORDER — BUPROPION HCL ER (XL) 300 MG PO TB24: ORAL_TABLET | ORAL | 3 refills | Status: DC

## 2021-09-13 MED ORDER — AMPHETAMINE-DEXTROAMPHETAMINE 30 MG PO TABS: ORAL_TABLET | ORAL | 0 refills | Status: DC

## 2021-09-13 NOTE — Patient Instructions (Signed)
Living With Attention Deficit Hyperactivity Disorder If you have been diagnosed with attention deficit hyperactivity disorder (ADHD), you may be relieved that you now know why you have felt or behaved a certain way. Still, you may feel overwhelmed about the treatment ahead. You may also wonder how to get the support you need and how to deal with the condition day-to-day. With treatment and support, you can live with ADHD and manage your symptoms. How to manage lifestyle changes Managing stress Stress is your body's reaction to life changes and events, both good and bad. To cope with the stress of an ADHD diagnosis, it may help to: Learn more about ADHD. Exercise regularly. Even a short daily walk can lower stress levels. Participate in training or education programs (including social skills training classes) that teach you to deal with symptoms.  Medicines Your health care provider may suggest certain medicines if he or she feels that they will help to improve your condition. Stimulant medicines are usually prescribed to treat ADHD, and therapy may also be prescribed. It is important to: Avoid using alcohol and other substances that may prevent your medicines from working properly (may interact). Talk with your pharmacist or health care provider about all the medicines that you take, their possible side effects, and what medicines are safe to take together. Make it your goal to take part in all treatment decisions (shared decision-making). Ask about possible side effects of medicines that your health care provider recommends, and tell him or her how you feel about having those side effects. It is best if shared decision-making with your health care provider is part of your total treatment plan. Relationships To strengthen your relationships with family members while treating your condition, consider taking part in family therapy. You might also attend self-help groups alone or with a loved one. Be  honest about how your symptoms affect your relationships. Make an effort to communicate respectfully instead of fighting, and find ways to show others that you care. Psychotherapy may be useful in helping you cope with how ADHD affects your relationships. How to recognize changes in your condition The following signs may mean that your treatment is working well and your condition is improving: Consistently being on time for appointments. Being more organized at home and work. Other people noticing improvements in your behavior. Achieving goals that you set for yourself. Thinking more clearly. The following signs may mean that your treatment is not working very well: Feeling impatience or more confusion. Missing, forgetting, or being late for appointments. An increasing sense of disorganization and messiness. More difficulty in reaching goals that you set for yourself. Loved ones becoming angry or frustrated with you. Follow these instructions at home: Take over-the-counter and prescription medicines only as told by your health care provider. Check with your health care provider before taking any new medicines. Create structure and an organized atmosphere at home. For example: Make a list of tasks, then rank them from most important to least important. Work on one task at a time until your listed tasks are done. Make a daily schedule and follow it consistently every day. Use an appointment calendar, and check it 2 or 3 times a day to keep on track. Keep it with you when you leave the house. Create spaces where you keep certain things, and always put things back in their places after you use them. Keep all follow-up visits as told by your health care provider. This is important. Where to find support Talking to others    Keep emotion out of important discussions and speak in a calm, logical way. Listen closely and patiently to your loved ones. Try to understand their point of view, and try to  avoid getting defensive. Take responsibility for the consequences of your actions. Ask that others do not take your behaviors personally. Aim to solve problems as they come up, and express your feelings instead of bottling them up. Talk openly about what you need from your loved ones and how they can support you. Consider going to family therapy sessions or having your family meet with a specialist who deals with ADHD-related behavior problems. Finances Not all insurance plans cover mental health care, so it is important to check with your insurance carrier. If paying for co-pays or counseling services is a problem, search for a local or county mental health care center. Public mental health care services may be offered there at a low cost or no cost when you are not able to see a private health care provider. If you are taking medicine for ADHD, you may be able to get the generic form, which may be less expensive than brand-name medicine. Some makers of prescription medicines also offer help to patients who cannot afford the medicines that they need. Questions to ask your health care provider: What are the risks and benefits of taking medicines? Would I benefit from therapy? How often should I follow up with a health care provider? Contact a health care provider if: You have side effects from your medicines, such as: Repeated muscle twitches, coughing, or speech outbursts. Sleep problems. Loss of appetite. Depression. New or worsening behavior problems. Dizziness. Unusually fast heartbeat. Stomach pains. Headaches. Get help right away if: You have a severe reaction to a medicine. Your behavior suddenly gets worse. Summary With treatment and support, you can live with ADHD and manage your symptoms. The medicines that are most often prescribed for ADHD are stimulants. Consider taking part in family therapy or self-help groups with family members or friends. When you talk with friends  and family about your ADHD, be patient and communicate openly. Take over-the-counter and prescription medicines only as told by your health care provider. Check with your health care provider before taking any new medicines. This information is not intended to replace advice given to you by your health care provider. Make sure you discuss any questions you have with your health care provider. Document Revised: 09/13/2019 Document Reviewed: 10/16/2019 Elsevier Patient Education  2023 Elsevier Inc.  

## 2021-09-13 NOTE — Progress Notes (Signed)
? ? ?  Future Appointments  ?Date Time Provider Department  ?09/13/2021 11:30 AM Lucky Cowboy, MD [facility]  ? ? ?History of Present Illness: ? ?   Patient is avery nice 41 yo single WM last seen July 2022 and has moved to [place] & is home for a visit & to establish ongoing treatment for his ADD. 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 He is amenable to starting maintenance Wellbutrin in anticipation of having less need for the Adderall.  ? ? ?Medications ?  ?  amphetamine-dextroamphetamine (ADDERALL) 20 MG tablet, Take 1 tablet in the Morning and 1/2 tablet in the Afternoon if needed for ADD, Focus & Concentration - Limit to 5 days /week ? ?Problem list ?He has ADD. ?  ?Observations/Objective: ? ?BP 128/84   Pulse 69   Temp 97.9 ?F (36.6 ?C)   Resp 16   Ht 6' (1.829 m)   Wt 167 lb 6.4 oz (75.9 kg)   SpO2 96%   BMI 22.70 kg/m?  ? ?HEENT - WNL. ?Neck - supple.  ?Chest - Clear equal BS. ?Cor - Nl HS. RRR w/o sig MGR. PP 1(+). No edema. ?MS- FROM w/o deformities.  Gait Nl. ?Neuro -  Nl w/o focal abnormalities. ? ? ?Assessment and Plan: ? ?1. Attention deficit hyperactivity disorder (ADHD), predominantly inattentive type ? ?- buPROPion  XL 300 MG  ?Take 1 tablet Daily for Mood, Focus & Concentration.   ?Dispense: 90 tablet; Refill: 3 ? ?- ADDERALL 30 MG tablet;  ?Take 1/4 to 1/2 to 1 tablet 1 to 2 x /day as needed for ADD, Focus & Concentration   ?Dispense: 60 tablet; Refill: 0 ? ? ?Follow Up Instructions: ? ?  ?    I discussed the assessment and treatment plan with the patient. The patient was provided an opportunity to ask questions and all were answered. The patient agreed with the plan and demonstrated an understanding of the instructions. ?  ?    The patient was advised to call back or seek an in-person evaluation if the symptoms worsen or if the condition fails to improve as anticipated. Patient is advise that he  should establish PCP  medical care local to his new residence.  ? ? ?Marinus Maw, MD ? ?

## 2022-05-09 NOTE — Progress Notes (Signed)
      Future Appointments  Date Time Provider Department  09/13/2021 11:30 AM Lucky Cowboy, MD [facility]    History of Present Illness:     Patient is avery nice 41 yo single WM with ADD who has has moved to [place] &  returns to maintain  ongoing treatment for his ADD. 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 He was started on maintenance Wellbutrin  in May 2023  in anticipation of having less need for the Adderall & apparently took for 3-4 week, but stopped it as he felt it made him feel irritable.  Today, he's also c/o of a sore throat, mild head congestion. No Chest congestion or cough.    Current Outpatient Medications  Medication Instructions   ADDERALL 30 MG tablet Take 1/4-1/2- tablet 1-2 x /day as needed for ADD, Focus & Concentration   buPROPion  XL 300 MG  Take 1 tablet Daily for Mood, Focus & Concentration.     Problem list He has ADD.   Observations/Objective:  BP 108/72   Pulse 85   Temp 97.7 F (36.5 C)   Resp 16   Ht 6' (1.829 m)   Wt 186 lb 3.2 oz (84.5 kg)   SpO2 96%   BMI 25.25 kg/m   HEENT - EACs / TMs - Nl . No Fronto-Maxillary tenderness.                       O/P 2 + injected posterior pharynx, tonsillar pillars & soft palate w/o exudates.  Neck - supple. No sig Cx LNs.  Chest - Clear equal BS. Cor - Nl HS. RRR w/o sig MGR. PP 1(+). No edema. MS- FROM w/o deformities.  Gait Nl. Neuro -  Nl w/o focal abnormalities.   Assessment and Plan:  1. Attention deficit hyperactivity disorder (ADHD), predominantly inattentive type  - amphetamine-dextroamphetamine (ADDERALL) 30 MG tablet;  Take 1/4 to 1/2 to 1 tablet 1 to 2 x /day as needed for ADD, Focus & Concentration   Dispense: 60 tablet; Refill: 0  2. Pharyngitis, suspect viral etiology  - dexamethasone  4 MG tablet;  Take 1 tab 3 x day - 3 days, then 2 x day - 3 days, then 1 tab daily   Dispense: 20 tablet; Refill:  0   Follow Up Instructions:        I discussed the assessment and treatment plan with the patient. The patient was provided an opportunity to ask questions and all were answered. The patient agreed with the plan and demonstrated an understanding of the instructions.       The patient was advised to call back or seek an in-person evaluation if the symptoms worsen or if the condition fails to improve as anticipated. Patient is advise that he should establish PCP  medical care local to his new residence.        Since patient has moved to [place] for permanent residence, he is strongly encouraged to establish medicare care there and also encouraged to schedule a comprehensive screening PE to establish a baseline.    Marinus Maw, MD

## 2022-05-10 ENCOUNTER — Encounter: Payer: Self-pay | Admitting: Internal Medicine

## 2022-05-10 ENCOUNTER — Ambulatory Visit (INDEPENDENT_AMBULATORY_CARE_PROVIDER_SITE_OTHER): Payer: Self-pay | Admitting: Internal Medicine

## 2022-05-10 VITALS — BP 108/72 | HR 85 | Temp 97.7°F | Resp 16 | Ht 72.0 in | Wt 186.2 lb

## 2022-05-10 DIAGNOSIS — F900 Attention-deficit hyperactivity disorder, predominantly inattentive type: Secondary | ICD-10-CM

## 2022-05-10 DIAGNOSIS — J029 Acute pharyngitis, unspecified: Secondary | ICD-10-CM

## 2022-05-10 MED ORDER — DEXAMETHASONE 4 MG PO TABS: ORAL_TABLET | ORAL | 0 refills | Status: DC

## 2022-05-10 MED ORDER — AMPHETAMINE-DEXTROAMPHETAMINE 30 MG PO TABS: ORAL_TABLET | ORAL | 0 refills | Status: DC

## 2022-05-10 NOTE — Patient Instructions (Signed)
Living With Attention Deficit Hyperactivity Disorder If you have been diagnosed with attention deficit hyperactivity disorder (ADHD), you may be relieved that you now know why you have felt or behaved a certain way. Still, you may feel overwhelmed about the treatment ahead. You may also wonder how to get the support you need and how to deal with the condition day-to-day. With treatment and support, you can live with ADHD and manage your symptoms. How to manage lifestyle changes Managing lifestyle changes can be challenging. Seeking support from your healthcare provider, therapist, family, and friends can be helpful. How to recognize changes in your condition The following signs may mean that your treatment is working well and your condition is improving: Consistently being on time for appointments. Being more organized at home and work. Other people noticing improvements in your behavior. Achieving goals that you set for yourself. Thinking more clearly. The following signs may mean that your treatment is not working very well: Feeling impatience or more confusion. Missing, forgetting, or being late for appointments. An increasing sense of disorganization and messiness. More difficulty in reaching goals that you set for yourself. Loved ones becoming angry or frustrated with you. Follow these instructions at home: Medicines Take over-the-counter and prescription medicines only as told by your health care provider. Check with your health care provider before taking any new medicines. General instructions Create structure and an organized atmosphere at home. For example: Make a list of tasks, then rank them from most important to least important. Work on one task at a time until your listed tasks are done. Make a daily schedule and follow it consistently every day. Use an appointment calendar, and check it 2-3 times a day to keep on track. Keep it with you when you leave the house. Create  spaces where you keep certain things, and always put things back in their places after you use them. Keep all follow-up visits. Your health care provider will need to monitor your condition and adjust your treatment over time. Where to find support Talking to others  Keep emotion out of important discussions and speak in a calm, logical way. Listen closely and patiently to your loved ones. Try to understand their point of view, and try to avoid getting defensive. Take responsibility for the consequences of your actions. Ask that others do not take your behaviors personally. Aim to solve problems as they come up, and express your feelings instead of bottling them up. Talk openly about what you need from your loved ones and how they can support you. Consider going to family therapy sessions or having your family meet with a specialist who deals with ADHD-related behavior problems. Finances Not all insurance plans cover mental health care, so it is important to check with your insurance carrier. If paying for co-pays or counseling services is a problem, search for a local or county mental health care center. Public mental health care services may be offered there at a low cost or no cost when you are not able to see a private health care provider. If you are taking medicine for ADHD, you may be able to get the generic form, which may be less expensive than brand-name medicine. Some makers of prescription medicines also offer help to patients who cannot afford the medicines that they need. Therapy and support groups Talking with a mental health care provider and participating in support groups can help to improve your quality of life, daily functioning, and overall symptoms. Questions to ask your health   care provider: What are the risks and benefits of taking medicines? Would I benefit from therapy? How often should I follow up with a health care provider? Where to find more information Learn more  about ADHD from: Children and Adults with Attention Deficit Hyperactivity Disorder: chadd.org National Institute of Mental Health: nimh.nih.gov Centers for Disease Control and Prevention: cdc.gov Contact a health care provider if: You have side effects from your medicines, such as: Repeated muscle twitches, coughing, or speech outbursts. Sleep problems. Loss of appetite. Dizziness. Unusually fast heartbeat. Stomach pains. Headaches. You have new or worsening behavior problems. You are struggling with anxiety, depression, or substance abuse. Get help right away if: You have a severe reaction to a medicine. These symptoms may be an emergency. Get help right away. Call 911. Do not wait to see if the symptoms will go away. Do not drive yourself to the hospital. Take one of these steps if you feel like you may hurt yourself or others, or have thoughts about taking your own life: Go to your nearest emergency room. Call 911. Call the National Suicide Prevention Lifeline at 1-800-273-8255 or 988. This is open 24 hours a day. Text the Crisis Text Line at 741741. Summary With treatment and support, you can live with ADHD and manage your symptoms. Consider taking part in family therapy or self-help groups with family members or friends. When you talk with friends and family about your ADHD, be patient and communicate openly. Keep all follow-up visits. Your health care provider will need to monitor your condition and adjust your treatment over time. This information is not intended to replace advice given to you by your health care provider. Make sure you discuss any questions you have with your health care provider. Document Revised: 08/20/2021 Document Reviewed: 08/20/2021 Elsevier Patient Education  2023 Elsevier Inc.  

## 2022-09-27 ENCOUNTER — Encounter: Payer: Self-pay | Admitting: Internal Medicine

## 2022-09-27 ENCOUNTER — Ambulatory Visit (INDEPENDENT_AMBULATORY_CARE_PROVIDER_SITE_OTHER): Payer: Self-pay | Admitting: Internal Medicine

## 2022-09-27 VITALS — BP 136/84 | HR 17 | Temp 98.0°F | Resp 17 | Ht 72.0 in | Wt 186.4 lb

## 2022-09-27 DIAGNOSIS — F900 Attention-deficit hyperactivity disorder, predominantly inattentive type: Secondary | ICD-10-CM

## 2022-09-27 DIAGNOSIS — J0141 Acute recurrent pansinusitis: Secondary | ICD-10-CM

## 2022-09-27 MED ORDER — AMPHETAMINE-DEXTROAMPHETAMINE 30 MG PO TABS: ORAL_TABLET | ORAL | 0 refills | Status: DC

## 2022-09-27 MED ORDER — AZITHROMYCIN 250 MG PO TABS: ORAL_TABLET | ORAL | 1 refills | Status: DC

## 2022-09-27 MED ORDER — PSEUDOEPHEDRINE HCL ER 120 MG PO TB12: ORAL_TABLET | ORAL | 3 refills | Status: DC

## 2022-09-27 MED ORDER — DEXAMETHASONE 4 MG PO TABS: ORAL_TABLET | ORAL | 1 refills | Status: DC

## 2022-09-27 NOTE — Progress Notes (Signed)
      Future Appointments  Date Time Provider Department  09/13/2021 11:30 AM Lucky Cowboy, MD [facility]    History of Present Illness:     Patient is avery nice 42 yo single WM with ADD who has has moved to [place] &  returns to maintain  ongoing treatment for his ADD. 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  No Chest congestion or cough.    Current Outpatient Medications  Medication Instructions   ADDERALL 30 MG tablet Take 1/4-1/2- tablet 1-2 x /day as needed for ADD, Focus & Concentration    Problem list He has ADD.   Observations/Objective:  BP 136/84   Pulse (!) 17   Temp 98 F (36.7 C)   Resp 17   Ht 6' (1.829 m)   Wt 186 lb 6.4 oz (84.6 kg)   SpO2 97%   BMI 25.28 kg/m   Dry cough . No Stridor   HEENT - EACs / TMs - Nl . (+)  Fronto-Maxillary tenderness.                       O/P 2 + injected posterior pharynx, tonsillar pillars & soft palate w/o exudates.  Neck - supple. No sig Cx LNs.  Chest - Clear equal BS w/o rales, rhonchi or wheezes.  Cor - Nl HS. RRR w/o sig MGR. PP 1(+). No edema. MS- FROM w/o deformities.  Gait Nl. Neuro -  Nl w/o focal abnormalities.  Assessment and Plan:  1. Acute recurrent pansinusitis  - dexamethasone (DECADRON) 4 MG tablet;  Take 1 tab 3 x day - 3 days, then 2 x day - 3 days, then 1 tab daily   Dispense: 20 tablet; Refill: 1  - pseudoephedrine (SUDAFED) 120 MG 12 hr tablet;  Take  1 tablet  2 x /day (every 12 hours)  for Sinus & Chest Congestion   Dispense: 60 tablet; Refill: 3  - azithromycin (ZITHROMAX) 250 MG tablet;    Dispense: 6 each; Refill: 1    2. Attention deficit hyperactivity disorder (ADHD), predominantly inattentive type  - amphetamine-dextroamphetamine (ADDERALL) 30 MG tablet;  Take 1/4 to 1/2 to 1 tablet 1 to 2 x /day as  needed for ADD, Focus & Concentration   Dispense: 60 tablet; Refill: 0    Follow Up Instructions:        I discussed the assessment and treatment plan with the patient. The patient was provided an opportunity to ask questions and all were answered. The patient agreed with the plan and demonstrated an understanding of the instructions.        The patient was advised to call back or seek an in-person evaluation if the symptoms worsen or if the condition fails to improve as anticipated. Patient is advise that he should establish PCP  medical care local to his new residence.        Since patient has moved to [place] for permanent residence, he is strongly encouraged to establish medicare care there and also encouraged to schedule a comprehensive screening PE to establish a baseline.    Marinus Maw, MD

## 2022-09-27 NOTE — Patient Instructions (Signed)
Amphetamine; Dextroamphetamine Tablets What is this medication? AMPHETAMINE; DEXTROAMPHETAMINE (am FET a meen; dex troe am FET a meen) treats attention-deficit hyperactivity disorder (ADHD). It works by improving focus and reducing impulsive behavior. It may also be used to treat narcolepsy. It works by promoting wakefulness. It belongs to a group of medications called stimulants. This medicine may be used for other purposes; ask your health care provider or pharmacist if you have questions. COMMON BRAND NAME(S): Adderall What should I tell my care team before I take this medication? They need to know if you have any of these conditions: Anxiety or panic attacks Circulation problems in fingers or toes (Raynaud syndrome) Glaucoma Heart attack Heart disease High blood pressure Kidney disease Liver disease Mental health conditions Seizures Stroke Substance use disorder Suicidal thoughts, plans, or attempt by you or a family member Thyroid disease Tourette syndrome An unusual or allergic reaction to dextroamphetamine, other medications, foods, dyes, or preservatives Pregnant or trying to get pregnant Breastfeeding How should I use this medication? Take this medication by mouth. Take it as directed on the prescription label at the same time every day. You can take it with or without food. If it upsets your stomach, take it with food. Keep taking it unless your care team tells you to stop. A special MedGuide will be given to you by the pharmacist with each prescription and refill. Be sure to read this information carefully each time. Talk to your care team about the use of this medication in children. While it may be prescribed for children as young as 3 years for selected conditions, precautions do apply. Overdosage: If you think you have taken too much of this medicine contact a poison control center or emergency room at once. NOTE: This medicine is only for you. Do not share this medicine  with others. What if I miss a dose? If you miss a dose, take it as soon as you can. If it is almost time for your next dose, take only that dose. Do not take double or extra doses. What may interact with this medication? Do not take this medication with any of the following: Linezolid MAOIs, such as Marplan, Nardil, and Parnate Methylene blue This medication may also interact with the following: Acetazolamide Alcohol Ascorbic acid Certain medications for depression, anxiety, or other mental health conditions Certain medications for migraines, such as sumatriptan Guanethidine Opioids Reserpine Sodium bicarbonate St. John's wort Thiazide diuretics, such as chlorothiazide Tryptophan This list may not describe all possible interactions. Give your health care provider a list of all the medicines, herbs, non-prescription drugs, or dietary supplements you use. Also tell them if you smoke, drink alcohol, or use illegal drugs. Some items may interact with your medicine. What should I watch for while using this medication? Visit your care team for regular checks on your progress. Tell your care team if your symptoms do not start to get better or if they get worse. This medication requires a new prescription from your care team every time it is filled at the pharmacy. This medication can be abused and cause your brain and body to depend on it after high doses or long term use. Your care team will assess your risk and monitor you closely during treatment. Long term use of this medication may cause your brain and body to depend on it. You may be able to take breaks from this medication during weekends, holidays, or summer vacations. Talk to your care team about what works for you. If  your care team wants you to stop this medication permanently, the dose may be slowly lowered over time to reduce the risk of side effects. Tell your care team if this medication loses its effects, or if you feel you need  to take more than the prescribed amount. Do not change your dose without talking to your care team. Do not take this medication close to bedtime. It may prevent you from sleeping. Loss of appetite is common when starting this medication. Eating small, frequent meals or snacks can help. Talk to your care team if appetite loss persists. Children should have height and weight checked often while taking this medication. Tell your care team right away if you notice unexplained wounds on your fingers and toes while taking this medication. You should also tell your care team if you experience numbness or pain, changes in the skin color, or sensitivity to temperature in your fingers or toes. Contact your care team right away if you have an erection that lasts longer than 4 hours or if it becomes painful. This may be a sign of a serious problem and must be treated right away to prevent permanent damage. What side effects may I notice from receiving this medication? Side effects that you should report to your care team as soon as possible: Allergic reactions--skin rash, itching, hives, swelling of the face, lips, tongue, or throat Heart attack--pain or tightness in the chest, shoulders, arms, or jaw, nausea, shortness of breath, cold or clammy skin, feeling faint or lightheaded Heart rhythm changes--fast or irregular heartbeat, dizziness, feeling faint or lightheaded, chest pain, trouble breathing Increase in blood pressure Irritability, confusion, fast or irregular heartbeat, muscle stiffness, twitching muscles, sweating, high fever, seizure, chills, vomiting, diarrhea, which may be signs of serotonin syndrome Mood and behavior changes--anxiety, nervousness, confusion, hallucinations, irritability, hostility, thoughts of suicide or self-harm, worsening mood, feelings of depression Prolonged or painful erection Raynaud syndrome--cool, numb, or painful fingers or toes that may change color from pale, to blue, to  red Seizures Stroke--sudden numbness or weakness of the face, arm, or leg, trouble speaking, confusion, trouble walking, loss of balance or coordination, dizziness, severe headache, change in vision Side effects that usually do not require medical attention (report these to your care team if they continue or are bothersome): Dry mouth Headache Loss of appetite with weight loss Nausea Stomach pain Trouble sleeping This list may not describe all possible side effects. Call your doctor for medical advice about side effects. You may report side effects to FDA at 1-800-FDA-1088. Where should I keep my medication? Keep out of the reach of children and pets. This medication can be abused. Keep it in a safe place to protect it from theft. Do not share it with anyone. It is only for you. Selling or giving away this medication is dangerous and against the law. Store at room temperature between 20 and 25 degrees C (68 and 77 degrees F). Protect from light and moisture. Keep container tightly closed. Get rid of any unused medication after the expiration date. This medication may cause harm and death if it is taken by other adults, children, or pets. It is important to get rid of the medication as soon as you no longer need it, or it is expired. You can do this in two ways: Take the medication to a medication take-back program. Check with your pharmacy or law enforcement to find a location. If you cannot return the medication, check the label or package insert to see  if the medication should be thrown out in the garbage or flushed down the toilet. If you are not sure, ask your care team. If it is safe to put it in the trash, take the medication out of the container. Mix the medication with cat litter, dirt, coffee grounds, or other unwanted substance. Seal the mixture in a bag or container. Put it in the trash. NOTE: This sheet is a summary. It may not cover all possible information. If you have questions about  this medicine, talk to your doctor, pharmacist, or health care provider.  2023 Elsevier/Gold Standard (2022-01-18 00:00:00)

## 2023-01-25 ENCOUNTER — Other Ambulatory Visit: Payer: Self-pay | Admitting: Internal Medicine

## 2023-01-25 DIAGNOSIS — F900 Attention-deficit hyperactivity disorder, predominantly inattentive type: Secondary | ICD-10-CM

## 2023-01-25 MED ORDER — AMPHETAMINE-DEXTROAMPHETAMINE 30 MG PO TABS: ORAL_TABLET | ORAL | 0 refills | Status: DC

## 2023-03-20 ENCOUNTER — Other Ambulatory Visit: Payer: Self-pay | Admitting: Internal Medicine

## 2023-03-20 DIAGNOSIS — F900 Attention-deficit hyperactivity disorder, predominantly inattentive type: Secondary | ICD-10-CM

## 2023-03-20 MED ORDER — SERTRALINE HCL 50 MG PO TABS: ORAL_TABLET | ORAL | 0 refills | Status: DC

## 2023-03-20 MED ORDER — AMPHETAMINE-DEXTROAMPHETAMINE 30 MG PO TABS: ORAL_TABLET | ORAL | 0 refills | Status: AC

## 2023-06-16 ENCOUNTER — Other Ambulatory Visit: Payer: Self-pay | Admitting: Internal Medicine

## 2023-06-27 ENCOUNTER — Encounter: Payer: Self-pay | Admitting: *Deleted

## 2023-07-20 ENCOUNTER — Encounter: Payer: Self-pay | Admitting: *Deleted
